# Patient Record
Sex: Male | Born: 2000 | Race: Black or African American | Hispanic: No | Marital: Single | Smoking: Never smoker
Health system: Southern US, Community
[De-identification: ages and names within clinical notes are randomized; demographics above are authoritative.]

## PROBLEM LIST (undated history)

## (undated) DIAGNOSIS — F840 Autistic disorder: Secondary | ICD-10-CM

## (undated) DIAGNOSIS — K219 Gastro-esophageal reflux disease without esophagitis: Secondary | ICD-10-CM

## (undated) DIAGNOSIS — B948 Sequelae of other specified infectious and parasitic diseases: Secondary | ICD-10-CM

## (undated) HISTORY — DX: Sequelae of other specified infectious and parasitic diseases: B94.8

## (undated) HISTORY — PX: TONSILLECTOMY: SUR1361

---
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
## 2020-01-01 ENCOUNTER — Encounter (INDEPENDENT_AMBULATORY_CARE_PROVIDER_SITE_OTHER): Payer: Self-pay | Admitting: Nurse Practitioner

## 2020-01-01 NOTE — Progress Notes (Signed)
Subjective:    Patient ID: Mark Frazier, male    DOB: February 25, 2001, 19 y.o.   MRN: [id number] Chief Complaint  Patient presents with  . Follow-up    ultrasound follow up    Mark Frazier is a 19 y.o. male. Presents today for evaluation of cold hands with discoloration. The patient's mother also notes that his feet turn blue as well when cold. The patient has autism and is nonverbal however much of the history is obtained from his mother. Because he is nonverbal it is difficult to ascertain how much pain he is in however from his mannerisms, the patient's mother notes that he appears to be in pain. She notes that it does not happen with sudden temperature changes or activities. It does happen sporadically. Both hands are affected. Both feet are affected as well. He has had further work-up and there is some concern for autoimmune disease. Currently there are no open wounds or ulcerations on his feet or his hands. The patient's mother there are notes that warm baths seem to help his pain and discomfort.  Today noninvasive studies of the upper extremities appear he has a wrist brachial index of 0.99 on the right and 0.97 on the left. There is no significant arterial detection noted bilaterally. The patient has triphasic waveforms in the bilateral upper extremities as well as good finger PPG's bilaterally.     Review of Systems  Skin: Positive for color change (fingers and toes ).  All other systems reviewed and are negative.      Objective:   Physical Exam Vitals reviewed.  Cardiovascular:     Pulses: Normal pulses.  Skin:    General: Skin is warm and dry.     Capillary Refill: Capillary refill takes more than 3 seconds.  Neurological:     Mental Status: He is alert. Mental status is at baseline.     BP 102/68 (BP Location: Right Arm)   Pulse 84   Resp 16   Wt 124 lb 6.4 oz (56.4 kg)   BMI 18.91 kg/m   Past Medical History:  Diagnosis Date  . Autism   . GERD  (gastroesophageal reflux disease)   . 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 Marital status: Single    Spouse name: Not on file  . Number of children: Not on file  . Years of education: Not on file  . Highest education level: Not on file  Occupational History  . Not on file  Tobacco Use  . Smoking status: Never Smoker  . Smokeless tobacco: Never Used  Vaping Use  . Vaping Use: Never used  Substance and Sexual Activity  . Alcohol use: Never  . Drug use: Never  . Sexual activity: Not on file  Other Topics Concern  . Not on file  Social History Narrative  . Not on file   Social Determinants of Health   Financial Resource Strain:   . Difficulty of Paying Living Expenses: Not on file  Food Insecurity:   . Worried About Programme researcher, broadcasting/film/video in the Last Year: Not on file  . Ran Out of Food in the Last Year: Not on file  Transportation Needs:   . Lack of Transportation (Medical): Not on file  . Lack of Transportation (Non-Medical): Not on file  Physical Activity:   . Days of Exercise per Week: Not on file  . Minutes  of Exercise per Session: Not on file  Stress:   . Feeling of Stress : Not on file  Social Connections:   . Frequency of Communication with Friends and Family: Not on file  . Frequency of Social Gatherings with Friends and Family: Not on file  . Attends Religious Services: Not on file  . Active Member of Clubs or Organizations: Not on file  . Attends Banker Meetings: Not on file  . Marital Status: Not on file  Intimate Partner Violence:   . Fear of Current or Ex-Partner: Not on file  . Emotionally Abused: Not on file  . Physically Abused: Not on file  . Sexually Abused: Not on file    Past Surgical History:  Procedure Laterality Date  . CERUMEN REMOVAL Bilateral 11/18/2019   Procedure: CERUMEN REMOVAL;  Surgeon: Linus Salmons, MD;   Location: [facility];  Service: ENT;  Laterality: Bilateral;  . TONSILLECTOMY      History reviewed. No pertinent family history.  Allergies  Allergen Reactions  . Salmon [Fish Allergy]     By allergy testing   . Amoxicillin Other (See Comments) and Rash    Rash, fever  . Penicillins Rash    CBC Latest Ref Rng & Units 07/24/2019  WBC 4.0 - 10.5 K/uL 4.4  Hemoglobin 13.0 - 17.0 g/dL 68.1  Hematocrit 39 - 52 % 47.4  Platelets 150 - 400 K/uL 214      CMP     Component Value Date/Time   NA 136 07/24/2019 1659   K 4.2 07/24/2019 1659   CL 102 07/24/2019 1659   CO2 25 07/24/2019 1659   GLUCOSE 120 (H) 07/24/2019 1659   BUN 13 07/24/2019 1659   CREATININE 0.91 07/24/2019 1659   CALCIUM 9.4 07/24/2019 1659   GFRNONAA >60 07/24/2019 1659   GFRAA >60 07/24/2019 1659          Assessment & Plan:   1. Raynaud's disease without gangrene Based on noninvasive studies today, the diagnosis of Raynaud's disease is confirmed.  Typically Raynaud's is treated with calcium channel blockers or other vasodilators however given the patient's young age, his blood pressure is on the relatively low side.  Because of this we will abstain from prescribing any medications at this time.  The risk that the patient's blood pressure may drop too low and cause falls is a significant concern.  Given that the patient does not have any wounds or ulcerations at this time he will continue with the conservative measures and to reduce by his mother such as utilizing warm water and Tylenol.  The patient is also advised to maintain warmth of the upper and lower extremities by utilizing gloves and heavy socks.  We will bring the patient back in 3 months to reevaluate as patients typically have an improvement in symptoms during the winter as well as summer.  However the patient's mother is advised to contact us sooner if issues arise.  2. Autism The patient is nonverbal and this makes determining pain  somewhat difficult however his mother is able to provide good history.   Current Outpatient Medications on File Prior to Visit  Medication Sig Dispense Refill  . cefdinir (OMNICEF) 300 MG capsule TAKE 1 CAPSULE BY MOUTH TWICE DAILY FOR 10 DAYS    . Multiple Vitamin (MULTIVITAMIN) tablet Take 1 tablet by mouth daily.    Marland Kitchen omeprazole (PRILOSEC) 20 MG capsule Take 20 mg by mouth daily.     No current facility-administered medications  on file prior to visit.    There are no Patient Instructions on file for this visit. Return in about 3 months (around 03/31/2020).   Georgiana Spinner, NP

## 2020-02-29 ENCOUNTER — Other Ambulatory Visit: Payer: Self-pay | Admitting: Otolaryngology

## 2020-02-29 DIAGNOSIS — R1310 Dysphagia, unspecified: Secondary | ICD-10-CM

## 2020-03-08 ENCOUNTER — Ambulatory Visit
Admission: RE | Admit: 2020-03-08 | Discharge: 2020-03-08 | Disposition: A | Discharge status: Home / Self Care | Payer: Medicaid Other | Source: Ambulatory Visit | Attending: Otolaryngology | Admitting: Otolaryngology

## 2020-03-08 ENCOUNTER — Telehealth: Payer: Self-pay | Admitting: Speech Pathology

## 2020-03-08 ENCOUNTER — Other Ambulatory Visit: Payer: Self-pay

## 2020-03-08 DIAGNOSIS — R1310 Dysphagia, unspecified: Secondary | ICD-10-CM | POA: Diagnosis not present

## 2020-03-08 NOTE — Therapy (Signed)
[facility] DIAGNOSTIC RADIOLOGY [address] [place], [place], [address] Phone: [phone]   Fax:     Modified Barium Swallow  Patient Details  Name: Mark Frazier MRN: [id number] Date of Birth: 11-Feb-2001 No data recorded  Encounter Date: 03/08/2020   End of Session - 03/08/20 1317    Visit Number 1    Number of Visits 1    Date for SLP Re-Evaluation 03/08/20    SLP Start Time 1142    SLP Stop Time  1223    SLP Time Calculation (min) 41 min    Activity Tolerance Other (comment)   limited by ability to follow commands         Objective Swallowing Evaluation: Type of Study: MBS-Modified Barium Swallow Study   Patient Details  Name: Mark Frazier MRN: [id number] Date of Birth: October 01, 2000  Today's Date: 03/08/2020 Time: 5093-2671 No data recorded  Past Medical History:  Past Medical History:  Diagnosis Date  . Autism   . GERD (gastroesophageal reflux disease)   . 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 CERUMEN REMOVAL Bilateral 11/18/2019   Procedure: CERUMEN REMOVAL;  Surgeon: Linus Salmons, MD;  Location: [facility];  Service: ENT;  Laterality: Bilateral;  . TONSILLECTOMY     HPI: Patient is a 20 y.o. 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 Recently diagnosed with Raynaud's disease. Prior to strep infection, mother reported patient responded verbally and was "a good eater." Now he is essentially non-verbal, less responsive, and has an aversion to swallowing. Mother hopes to learn if he has a problem in his throat that is contributing to this. He has lost 10 lbs. She states he will swallow with coaxing, but intake has been poor.   Subjective: does not respond verbally; mother reports patient  c/o odynophagia    Assessment / Plan / Recommendation  [facility] IP CLINICAL IMPRESSIONS 03/08/2020  Clinical Impression From limited assessment today, patient appears to have sensory oral-based dysphagia. Unable to rule out pharyngeal or cervical esophageal phase dysphagia, because patient did not initiate a pharyngeal swallow, even with max cues (verbal, tactile), mother's presence and prompting. Pt held all boluses orally, primarily anteriorly but with mother's cue to tip his head backward, continued to hold bolus posteriorly as well. Max cues to expectorate. Pt would also not intiate a pharyngeal swallow when provided with plain thin liquid or plain applesauce. No focal weakness is observed; anatomy appears WNL. As patient has indicated odynophagia to mother, consider possibility of referred esophageal sensation, or a sensory aversion to swallowing the developed during pt's course of strep throat. Methods of assessment that do not require pt to follow commands are likely to yield more information. If pt tolerates direct laryngoscopy, FEES would be an option for swallow assessment, although this procedure may be unnecessary as I suspect pt's swallowing difficulties are most likely sensory vs structural. He may benefit from referral to either OT or SLP who has experience with feeding disorders and autism. Mother present for assessment and findings, recommendations were discussed with her in detail.  SLP Visit Diagnosis Dysphagia, oral phase (R13.11)  Attention and concentration deficit following --  Frontal lobe and executive function deficit following --  Impact on safety and function Risk for inadequate nutrition/hydration      [facility] IP TREATMENT RECOMMENDATION 03/08/2020  Treatment  Recommendations No treatment recommended at this time     Prognosis 03/08/2020  Prognosis for Safe Diet Advancement (No Data)  Barriers to Reach Goals Behavior  Barriers/Prognosis Comment --    [facility] IP DIET RECOMMENDATION  03/08/2020  SLP Diet Recommendations Regular solids;Dysphagia 3 (Mech soft) solids;Thin liquid  Liquid Administration via Cup;Straw  Medication Administration Other (Comment)  Compensations Small sips/bites;Minimize environmental distractions  Postural Changes Seated upright at 90 degrees      CHL IP OTHER RECOMMENDATIONS 03/08/2020  Recommended Consults Other (Comment)  Oral Care Recommendations Oral care BID  Other Recommendations --      CH[facility] IP FOLLOW UP RECOMMENDATIONS 03/08/2020  Follow up Recommendations Other (comment)      No flowsheet data found.         CHL IP ORAL PHASE 03/08/2020  Oral Phase Impaired  Oral - Pudding Teaspoon --  Oral - Pudding Cup --  Oral - Honey Teaspoon --  Oral - Honey Cup --  Oral - Nectar Teaspoon --  Oral - Nectar Cup --  Oral - Nectar Straw --  Oral - Thin Teaspoon Holding of bolus  Oral - Thin Cup Holding of bolus  Oral - Thin Straw Holding of bolus  Oral - Puree Holding of bolus  Oral - Mech Soft --  Oral - Regular --  Oral - Multi-Consistency --  Oral - Pill --  Oral Phase - Comment --    CHL IP PHARYNGEAL PHASE 03/08/2020  Pharyngeal Phase (No Data)  Pharyngeal- Pudding Teaspoon --  Pharyngeal --  Pharyngeal- Pudding Cup --  Pharyngeal --  Pharyngeal- Honey Teaspoon --  Pharyngeal --  Pharyngeal- Honey Cup --  Pharyngeal --  Pharyngeal- Nectar Teaspoon --  Pharyngeal --  Pharyngeal- Nectar Cup --  Pharyngeal --  Pharyngeal- Nectar Straw --  Pharyngeal --  Pharyngeal- Thin Teaspoon --  Pharyngeal --  Pharyngeal- Thin Cup --  Pharyngeal --  Pharyngeal- Thin Straw --  Pharyngeal --  Pharyngeal- Puree --  Pharyngeal --  Pharyngeal- Mechanical Soft --  Pharyngeal --  Pharyngeal- Regular --  Pharyngeal --  Pharyngeal- Multi-consistency --  Pharyngeal --  Pharyngeal- Pill --  Pharyngeal --  Pharyngeal Comment --     CHL IP CERVICAL ESOPHAGEAL PHASE 03/08/2020  Cervical Esophageal Phase (No Data)  Pudding Teaspoon --   Pudding Cup --  Honey Teaspoon --  Honey Cup --  Nectar Teaspoon --  Nectar Cup --  Nectar Straw --  Thin Teaspoon --  Thin Cup --  Thin Straw --  Puree --  Mechanical Soft --  Regular --  Multi-consistency --  Pill --  Cervical Esophageal Comment --    Mark Baton, Mark Frazier, Mark Frazier  Mark Frazier 03/08/2020, 1:53 PM                          Dysphagia, unspecified type - Plan: DG SWALLOW FUNC SPEECH PATH, DG SWALLOW FUNC SPEECH PATH        Problem List Patient Active Problem List   Diagnosis Date Noted  . 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 Autism 12/16/2019    Mark Frazier 03/08/2020, 1:53 PM  [facility] DIAGNOSTIC RADIOLOGY [address] [place], [place], [address] Phone: [phone]   Fax:     Name: Mark Frazier MRN: [id number] Date of Birth: 2000/10/31

## 2020-03-08 NOTE — Telephone Encounter (Signed)
Placed call to referring physician for OP SLP eval and tx orders for sensory dysphagia, requesting referral sent to [facility], fax [phone].   Rondel Baton, MS, Sports administrator

## 2020-03-27 ENCOUNTER — Ambulatory Visit (INDEPENDENT_AMBULATORY_CARE_PROVIDER_SITE_OTHER): Payer: Medicaid Other | Admitting: Vascular Surgery

## 2021-12-30 ENCOUNTER — Encounter (INDEPENDENT_AMBULATORY_CARE_PROVIDER_SITE_OTHER): Payer: Self-pay

## 2022-12-11 ENCOUNTER — Other Ambulatory Visit: Payer: Self-pay | Admitting: Unknown Physician Specialty

## 2022-12-12 ENCOUNTER — Other Ambulatory Visit: Payer: Self-pay | Admitting: Unknown Physician Specialty

## 2022-12-26 ENCOUNTER — Ambulatory Visit: Admission: RE | Admit: 2022-12-26 | Payer: MEDICAID | Source: Home / Self Care | Admitting: Unknown Physician Specialty

## 2022-12-26 ENCOUNTER — Encounter: Admission: RE | Payer: Self-pay | Source: Home / Self Care

## 2022-12-26 SURGERY — EXAM UNDER ANESTHESIA
Anesthesia: General | Laterality: Bilateral
